# Patient Record
Sex: Female | Born: 1980 | Race: Black or African American | Hispanic: No | Marital: Married | Smoking: Never smoker
Health system: Southern US, Community
[De-identification: ages and names within clinical notes are randomized; demographics above are authoritative.]

## PROBLEM LIST (undated history)

## (undated) DIAGNOSIS — K219 Gastro-esophageal reflux disease without esophagitis: Secondary | ICD-10-CM

## (undated) DIAGNOSIS — T7840XA Allergy, unspecified, initial encounter: Secondary | ICD-10-CM

## (undated) HISTORY — DX: Allergy, unspecified, initial encounter: T78.40XA

---

## 2009-09-22 ENCOUNTER — Inpatient Hospital Stay (HOSPITAL_COMMUNITY): Admission: AD | Admit: 2009-09-22 | Discharge: 2009-09-22 | Payer: Self-pay | Admitting: Family Medicine

## 2009-09-22 ENCOUNTER — Ambulatory Visit: Payer: Self-pay | Admitting: Gynecology

## 2010-04-21 LAB — URINALYSIS, ROUTINE W REFLEX MICROSCOPIC
Bilirubin Urine: NEGATIVE
Glucose, UA: NEGATIVE mg/dL
Hgb urine dipstick: NEGATIVE
Ketones, ur: NEGATIVE mg/dL
Nitrite: NEGATIVE
Protein, ur: NEGATIVE mg/dL
Specific Gravity, Urine: 1.01 (ref 1.005–1.030)
Urobilinogen, UA: 0.2 mg/dL (ref 0.0–1.0)
pH: 6 (ref 5.0–8.0)

## 2010-04-21 LAB — POCT PREGNANCY, URINE: Preg Test, Ur: POSITIVE

## 2010-05-08 ENCOUNTER — Inpatient Hospital Stay (HOSPITAL_COMMUNITY)
Admission: AD | Admit: 2010-05-08 | Discharge: 2010-05-08 | Disposition: A | Discharge status: Home / Self Care | Payer: BC Managed Care – PPO | Source: Ambulatory Visit | Attending: Obstetrics and Gynecology | Admitting: Obstetrics and Gynecology

## 2010-05-08 ENCOUNTER — Inpatient Hospital Stay (HOSPITAL_COMMUNITY)
Admission: AD | Admit: 2010-05-08 | Discharge: 2010-05-11 | DRG: 373 | Disposition: A | Discharge status: Home / Self Care | Payer: BC Managed Care – PPO | Source: Ambulatory Visit | Attending: Obstetrics and Gynecology | Admitting: Obstetrics and Gynecology

## 2010-05-08 DIAGNOSIS — O99892 Other specified diseases and conditions complicating childbirth: Secondary | ICD-10-CM | POA: Diagnosis present

## 2010-05-08 DIAGNOSIS — O479 False labor, unspecified: Secondary | ICD-10-CM | POA: Insufficient documentation

## 2010-05-08 DIAGNOSIS — O411090 Infection of amniotic sac and membranes, unspecified, unspecified trimester, not applicable or unspecified: Secondary | ICD-10-CM | POA: Diagnosis present

## 2010-05-08 DIAGNOSIS — Z22330 Carrier of Group B streptococcus: Secondary | ICD-10-CM

## 2010-05-08 LAB — CBC
MCH: 30.1 pg (ref 26.0–34.0)
MCHC: 34.4 g/dL (ref 30.0–36.0)
Platelets: 249 10*3/uL (ref 150–400)

## 2010-05-08 LAB — RPR: RPR Ser Ql: NONREACTIVE

## 2010-05-09 ENCOUNTER — Other Ambulatory Visit: Payer: Self-pay | Admitting: Obstetrics and Gynecology

## 2010-05-12 NOTE — Discharge Summary (Signed)
  Alicia Boyer, Alicia Boyer NO.:  [id number]  MEDICAL RECORD NO.:  [id number]           PATIENT TYPE:  I  LOCATION:  9126                          FACILITY:  [facility]  PHYSICIAN:  Zenaida Niece, M.D.DATE OF BIRTH:  1980/04/29  DATE OF ADMISSION:  05/08/2010 DATE OF DISCHARGE:  05/11/2010                              DISCHARGE SUMMARY   ADMISSION DIAGNOSES: 1. Intrauterine pregnancy at 39 weeks, in labor. 2. Group B streptococcus carrier. 3. Meconium-stained amniotic fluid.  DISCHARGE DIAGNOSES: 1. Intrauterine pregnancy at 39 weeks, in labor. 2. Group B streptococcus carrier. 3. Meconium-stained amniotic fluid. 4. Probable chorioamnionitis.  PROCEDURE:  On May 09, 2010, she had a spontaneous vaginal delivery.  HISTORY AND PHYSICAL:  This is a 30 year old gravida 1, para 0 with an EGA of 39 plus weeks who presented to the office with complaint of regular contractions.  Cervix changed from 2 cm on the morning of May 08, 2010, to 5 cm in the office.  Prenatal care essentially uncomplicated.  PRENATAL LABS:  Blood type is O positive with negative antibody screen, rubella immune, 1-hour Glucola 92, group B strep is positive.  The remainder of her history is noncontributory.  PHYSICAL EXAMINATION:  VITAL SIGNS:  She is afebrile with stable vital signs.  Fetal heart tracing category 1 with contractions every 4-5 minutes. ABDOMEN:  Gravid, nontender with an estimated fetal weight of 7.5 pounds.  Cervix on my first exam, she is 6, 70, -1, vertex presentation, and membranes were ruptured, revealing thick meconium.  HOSPITAL COURSE:  The patient was admitted in labor and received an epidural.  She was put on penicillin for group B strep prophylaxis. Membranes were ruptured, revealing meconium.  She then had a protracted course and was started on Pitocin.  Just prior to delivery, she developed a temperature to 100.8 and fetal tachycardia, however, she progressed  to complete, pushed well, and early on the morning of May 09, 2010, had a vaginal delivery of a viable female infant with Apgars of 8 and 9, and weight of 6 pounds and 15 ounces.  The NICU team was at delivery for meconium.  Placenta delivered spontaneous was intact.  She had a second-degree laceration, repaired with 3-0 Vicryl.  She was not put on antibiotics after delivery and remained afebrile.  The baby ended up going to the NICU fairly early for hyperbilirubinemia and rule out sepsis.  The patient had no significant problems postpartum and again remained afebrile.  On postpartum day #2, she was felt to be stable for discharge home.  DISCHARGE INSTRUCTIONS:  Regular diet, pelvic rest, follow up in 6 weeks.  She was given our discharge pamphlet.  MEDICATIONS: 1. Percocet #20, 1-2 p.o. q.4-6 hours p.r.n. pain. 2. Over-the-counter ibuprofen as needed.     Zenaida Niece, M.D.     TDM/MEDQ  D:  05/11/2010  T:  05/11/2010  Job:  [id number]  Electronically Signed by Lavina Hamman M.D. on 05/12/2010 12:29:50 PM

## 2011-04-20 ENCOUNTER — Emergency Department (HOSPITAL_COMMUNITY): Payer: Worker's Compensation

## 2011-04-20 ENCOUNTER — Emergency Department (HOSPITAL_COMMUNITY)
Admission: EM | Admit: 2011-04-20 | Discharge: 2011-04-21 | Disposition: A | Discharge status: Home / Self Care | Payer: Worker's Compensation | Attending: Emergency Medicine | Admitting: Emergency Medicine

## 2011-04-20 ENCOUNTER — Encounter (HOSPITAL_COMMUNITY): Payer: Self-pay | Admitting: *Deleted

## 2011-04-20 DIAGNOSIS — S335XXA Sprain of ligaments of lumbar spine, initial encounter: Secondary | ICD-10-CM | POA: Insufficient documentation

## 2011-04-20 DIAGNOSIS — M545 Low back pain, unspecified: Secondary | ICD-10-CM | POA: Insufficient documentation

## 2011-04-20 DIAGNOSIS — X500XXA Overexertion from strenuous movement or load, initial encounter: Secondary | ICD-10-CM | POA: Insufficient documentation

## 2011-04-20 DIAGNOSIS — S39012A Strain of muscle, fascia and tendon of lower back, initial encounter: Secondary | ICD-10-CM

## 2011-04-20 DIAGNOSIS — Y93F2 Activity, caregiving, lifting: Secondary | ICD-10-CM | POA: Insufficient documentation

## 2011-04-20 MED ORDER — IBUPROFEN 800 MG PO TABS
800.0000 mg | ORAL_TABLET | Freq: Once | ORAL | Status: AC
  Administered 2011-04-21: 800 mg via ORAL
  Filled 2011-04-20: qty 1

## 2011-04-20 NOTE — ED Provider Notes (Addendum)
History     CSN: [id number]  Arrival date & time 04/20/11  2240   First MD Initiated Contact with Patient 04/20/11 2322      Chief Complaint  Patient presents with  . Back Pain    (Consider location/radiation/quality/duration/timing/severity/associated sxs/prior treatment) Patient is a 31 y.o. female presenting with back pain. The history is provided by the patient.  Back Pain  This is a new problem. The current episode started 3 to 5 hours ago. The problem occurs constantly. 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 The pain is present in the lumbar spine. The quality of the pain is described as stabbing. Radiates to: Radiates around to her bilateral sides. The pain is at a severity of 5/10. The pain is moderate. The symptoms are aggravated by bending, twisting and certain positions. The pain is the same all the time. Stiffness is present all day. Pertinent negatives include no chest pain, no abdominal pain, no bladder incontinence, no dysuria, no pelvic pain, no leg pain, no paresis, no tingling and no weakness. She has tried nothing for the symptoms. The treatment provided no relief.    History reviewed. No pertinent past medical history.  History reviewed. No pertinent past surgical history.  History reviewed. No pertinent family history.  History  Substance Use Topics  . Smoking status: Never Smoker   . Smokeless tobacco: Not on file  . Alcohol Use: No    OB History    Grav Para Term Preterm Abortions TAB SAB Ect Mult Living                  Review of Systems  Cardiovascular: Negative for chest pain.  Gastrointestinal: Negative for abdominal pain.  Genitourinary: Negative for bladder incontinence, dysuria and pelvic pain.  Musculoskeletal: Positive for back pain.  Neurological: Negative for tingling and  weakness.  All other systems reviewed and are negative.    Allergies  Review of patient's allergies indicates no known allergies.  Home Medications  No current outpatient prescriptions on file.  BP 144/75  Pulse 79  Temp 98.3 F (36.8 C)  Resp 15  SpO2 98%  Physical Exam  Nursing note and vitals reviewed. Constitutional: She is oriented to person, place, and time. She appears well-developed and well-nourished. No distress.  HENT:  Head: Normocephalic and atraumatic.  Eyes: EOM are normal. Pupils are equal, round, and reactive to light.  Neck: No spinous process tenderness and no muscular tenderness present.  Cardiovascular: Normal rate, regular rhythm, normal heart sounds and intact distal pulses.  Exam reveals no friction rub.   No murmur heard. Pulmonary/Chest: Effort normal and breath sounds normal. She has no wheezes. She has no rales.  Abdominal: Soft. Bowel sounds are normal. She exhibits no distension. There is no tenderness. There is no rebound and no guarding.  Musculoskeletal: Normal range of motion. She exhibits tenderness.       Lumbar back: She exhibits bony tenderness. She exhibits normal range of motion, no spasm and normal pulse.       No edema  Neurological: She is alert and oriented to person, place, and time. No cranial nerve deficit.  Skin: Skin is warm and dry. No rash noted.  Psychiatric: She has a normal mood and affect. Her behavior is normal.    ED Course  Procedures (  including critical care time)  Labs Reviewed - No data to display Dg Lumbar Spine Complete  04/21/2011  *RADIOLOGY REPORT*  Clinical Data: Injury to lower back; lower back pain.  LUMBAR SPINE - COMPLETE 4+ VIEW  Comparison: None.  Findings: There is no evidence of fracture or subluxation. Vertebral bodies demonstrate normal height and alignment. Intervertebral disc spaces are preserved.  The visualized neural foramina are grossly unremarkable in appearance.  The visualized bowel gas  pattern is unremarkable in appearance; air and stool are noted within the colon.  The sacroiliac joints are within normal limits.  IMPRESSION: No evidence of fracture or subluxation along the lumbar spine.  Original Report Authenticated By: Tonia Ghent, M.D.     1. Back strain       MDM   Patient with a back injury after trying to move a 200 kg patient. She was pulling on his bed sheet and felt acatch in her lower back. She has point tenderness around her L2-L3 radiation of the pain around to the sides. She has no lower motor deficits or pain.  Patient has no further complaints except for her lower back pain.  Normal vital signs the blood pressure here. Given mechanism of her injury will get plain films of her L-spine patient given ibuprofen.  1:40 AM Plain films neg and will give muscle relaxers and follow up.      Gwyneth Sprout, MD 04/21/11 0140  Gwyneth Sprout, MD 04/21/11 4098

## 2011-04-20 NOTE — ED Notes (Signed)
Went to check on pt.  Pt has been taken to radiology

## 2011-04-20 NOTE — ED Notes (Signed)
Pt c/o lower back pain tonight after she moved a pt up in the bed

## 2011-04-21 ENCOUNTER — Emergency Department (HOSPITAL_COMMUNITY): Payer: Worker's Compensation

## 2011-04-21 MED ORDER — IBUPROFEN 800 MG PO TABS: 800.0000 mg | ORAL_TABLET | Freq: Three times a day (TID) | ORAL | Status: AC

## 2011-04-21 NOTE — ED Notes (Signed)
Called radiology to inform them that pt upreg is negative.  Pt did not have x-ray on her first trip to radiology due to her concern that she may be pregnant

## 2011-04-21 NOTE — ED Notes (Signed)
Pt still awaiting phlebotomy to collect for her workers comp screening

## 2013-05-15 ENCOUNTER — Encounter (HOSPITAL_COMMUNITY): Payer: Self-pay | Admitting: Emergency Medicine

## 2013-05-15 ENCOUNTER — Emergency Department (HOSPITAL_COMMUNITY)
Admission: EM | Admit: 2013-05-15 | Discharge: 2013-05-15 | Disposition: A | Discharge status: Home / Self Care | Payer: BC Managed Care – PPO | Attending: Emergency Medicine | Admitting: Emergency Medicine

## 2013-05-15 DIAGNOSIS — L500 Allergic urticaria: Secondary | ICD-10-CM | POA: Insufficient documentation

## 2013-05-15 DIAGNOSIS — L509 Urticaria, unspecified: Secondary | ICD-10-CM

## 2013-05-15 MED ORDER — DIPHENHYDRAMINE HCL 25 MG PO CAPS: 25.0000 mg | ORAL_CAPSULE | Freq: Four times a day (QID) | ORAL | Status: DC | PRN

## 2013-05-15 MED ORDER — PREDNISONE 50 MG PO TABS: 50.0000 mg | ORAL_TABLET | Freq: Every day | ORAL | Status: DC

## 2013-05-15 NOTE — ED Notes (Signed)
Pt report some itchiness on her hands and back of left thigh.

## 2013-05-15 NOTE — Discharge Instructions (Signed)
Take the mediations prescribed. See your priamary care doctor, they might send you to an allergist. Return to the ER if there is any trouble breathing, swallowing.   Hives Hives are itchy, red, swollen areas of the skin. They can vary in size and location on your body. Hives can come and go for hours or several days (acute hives) or for several weeks (chronic hives). Hives do not spread from person to person (noncontagious). They may get worse with scratching, exercise, and emotional stress. CAUSES   Allergic reaction to food, additives, or drugs.  Infections, including the common cold.  Illness, such as vasculitis, lupus, or thyroid disease.  Exposure to sunlight, heat, or cold.  Exercise.  Stress.  Contact with chemicals. SYMPTOMS   Red or white swollen patches on the skin. The patches may change size, shape, and location quickly and repeatedly.  Itching.  Swelling of the hands, feet, and face. This may occur if hives develop deeper in the skin. DIAGNOSIS  Your caregiver can usually tell what is wrong by performing a physical exam. Skin or blood tests may also be done to determine the cause of your hives. In some cases, the cause cannot be determined. TREATMENT  Mild cases usually get better with medicines such as antihistamines. Severe cases may require an emergency epinephrine injection. If the cause of your hives is known, treatment includes avoiding that trigger.  HOME CARE INSTRUCTIONS   Avoid causes that trigger your hives.  Take antihistamines as directed by your caregiver to reduce the severity of your hives. Non-sedating or low-sedating antihistamines are usually recommended. Do not drive while taking an antihistamine.  Take any other medicines prescribed for itching as directed by your caregiver.  Wear loose-fitting clothing.  Keep all follow-up appointments as directed by your caregiver. SEEK MEDICAL CARE IF:   You have persistent or severe itching that is  not relieved with medicine.  You have painful or swollen joints. SEEK IMMEDIATE MEDICAL CARE IF:   You have a fever.  Your tongue or lips are swollen.  You have trouble breathing or swallowing.  You feel tightness in the throat or chest.  You have abdominal pain. These problems may be the first sign of a life-threatening allergic reaction. Call your local emergency services (911 in [place] MAKE SURE YOU:   Understand these instructions.  Will watch your condition.  Will get help right away if you are not doing well or get worse. Document Released: 01/22/2005 Document Revised: 07/24/2011 Document Reviewed: 04/17/2011 [facility] Norwegian StreetExitCare Patient Information 2014 [place], [place].

## 2013-05-15 NOTE — ED Notes (Signed)
Pt states she is having a reaction to something but does not know what  Pt states it started yesterday afternoon  Pt has welts noted on her hands  Pt states she has them on her thighs as well   Pt states it itches  Denies difficulty breathing or swallowing

## 2013-05-25 NOTE — ED Provider Notes (Signed)
CSN: [id number]     Arrival date & time 05/15/13  0442 History   First MD Initiated Contact with Patient 05/15/13 [phone]     Chief Complaint  Patient presents with  . Allergic Reaction     (Consider location/radiation/quality/duration/timing/severity/associated sxs/prior Treatment) HPI Comments: No hx of allergies in the past. No oral involvement. No dib, wheezing.  Patient is a 33 y.o. female presenting with allergic reaction. The history is provided by the patient.  Allergic Reaction Presenting symptoms: rash   Severity:  Mild Prior allergic episodes:  No prior episodes Context: no chemicals, no food allergies, no medications, no new detergents/soaps and no poison ivy   Ineffective treatments:  None tried   History reviewed. No pertinent past medical history. History reviewed. No pertinent past surgical history. History reviewed. No pertinent family history. History  Substance Use Topics  . Smoking status: Never Smoker   . Smokeless tobacco: Not on file  . Alcohol Use: No   OB History   Grav Para Term Preterm Abortions TAB SAB Ect Mult Living                 Review of Systems  Constitutional: Positive for activity change.  Respiratory: Negative for shortness of breath.   Cardiovascular: Negative for chest pain.  Gastrointestinal: Negative for nausea, vomiting and abdominal pain.  Genitourinary: Negative for dysuria.  Musculoskeletal: Negative for neck pain.  Skin: Positive for rash.  Neurological: Negative for headaches.      Allergies  Bee venom  Home Medications   Prior to Admission medications   Medication Sig Start Date End Date Taking? Authorizing Provider  ibuprofen (ADVIL,MOTRIN) 200 MG tablet Take 400 mg by mouth every 6 (six) hours as needed for moderate pain.   Yes Historical Provider, MD  pseudoephedrine-acetaminophen (TYLENOL SINUS) 30-500 MG TABS Take 1 tablet by mouth every 4 (four) hours as needed (cold symptoms).   Yes Historical Provider, MD   sodium-potassium bicarbonate (ALKA-SELTZER GOLD) TBEF dissolvable tablet Take 1 tablet by mouth 2 (two) times daily as needed (cold symptoms).   Yes Historical Provider, MD  diphenhydrAMINE (BENADRYL) 25 mg capsule Take 1 capsule (25 mg total) by mouth every 6 (six) hours as needed for itching. 05/15/13   Derwood KaplanAnkit Jahred Tatar, MD  predniSONE (DELTASONE) 50 MG tablet Take 1 tablet (50 mg total) by mouth daily. 05/15/13   Liridona Mashaw Rhunette CroftNanavati, MD   BP 119/76  Pulse 58  Temp(Src) 98.3 F (36.8 C) (Oral)  Resp 18  SpO2 100%  LMP 04/27/2013 Physical Exam  Nursing note and vitals reviewed. Constitutional: She is oriented to person, place, and time. She appears well-developed and well-nourished.  HENT:  Head: Normocephalic and atraumatic.  Eyes: EOM are normal. Pupils are equal, round, and reactive to light.  Neck: Neck supple.  Cardiovascular: Normal rate, regular rhythm and normal heart sounds.   No murmur heard. Pulmonary/Chest: Effort normal. No respiratory distress.  Abdominal: Soft. She exhibits no distension. There is no tenderness. There is no rebound and no guarding.  Neurological: She is alert and oriented to person, place, and time.  Skin: Skin is warm and dry. Rash noted.    ED Course  Procedures (including critical care time) Labs Review Labs Reviewed - No data to display  Imaging Review No results found.   EKG Interpretation None      MDM   Final diagnoses:  Urticaria of unknown origin    Pt with cc of rash, with itching. Wheals like lesion appreciated. Allergy meds given.  Derwood KaplanAnkit Idelia Caudell, MD 05/25/13 [phone]

## 2014-09-24 ENCOUNTER — Ambulatory Visit (INDEPENDENT_AMBULATORY_CARE_PROVIDER_SITE_OTHER): Payer: BLUE CROSS/BLUE SHIELD | Admitting: Urgent Care

## 2014-09-24 VITALS — BP 124/82 | HR 71 | Temp 98.8°F | Resp 16 | Ht 63.0 in | Wt 169.0 lb

## 2014-09-24 DIAGNOSIS — Z0000 Encounter for general adult medical examination without abnormal findings: Secondary | ICD-10-CM | POA: Diagnosis not present

## 2014-09-24 DIAGNOSIS — Z7185 Encounter for immunization safety counseling: Secondary | ICD-10-CM

## 2014-09-24 DIAGNOSIS — Z7189 Other specified counseling: Secondary | ICD-10-CM | POA: Diagnosis not present

## 2014-09-24 LAB — COMPREHENSIVE METABOLIC PANEL
ALK PHOS: 87 U/L (ref 33–115)
ALT: 19 U/L (ref 6–29)
AST: 17 U/L (ref 10–30)
Albumin: 4.1 g/dL (ref 3.6–5.1)
BUN: 8 mg/dL (ref 7–25)
CO2: 27 mmol/L (ref 20–31)
CREATININE: 0.73 mg/dL (ref 0.50–1.10)
Calcium: 9.2 mg/dL (ref 8.6–10.2)
Chloride: 100 mmol/L (ref 98–110)
Glucose, Bld: 88 mg/dL (ref 65–99)
POTASSIUM: 4.2 mmol/L (ref 3.5–5.3)
SODIUM: 138 mmol/L (ref 135–146)
TOTAL PROTEIN: 7.7 g/dL (ref 6.1–8.1)
Total Bilirubin: 0.5 mg/dL (ref 0.2–1.2)

## 2014-09-24 LAB — CBC
HCT: 41.2 % (ref 36.0–46.0)
Hemoglobin: 13.7 g/dL (ref 12.0–15.0)
MCH: 29.3 pg (ref 26.0–34.0)
MCHC: 33.3 g/dL (ref 30.0–36.0)
MCV: 88 fL (ref 78.0–100.0)
MPV: 10.4 fL (ref 8.6–12.4)
PLATELETS: 371 10*3/uL (ref 150–400)
RBC: 4.68 MIL/uL (ref 3.87–5.11)
RDW: 13.3 % (ref 11.5–15.5)
WBC: 5.5 10*3/uL (ref 4.0–10.5)

## 2014-09-24 LAB — HEMOGLOBIN A1C
HEMOGLOBIN A1C: 5.6 % (ref <5.7)
MEAN PLASMA GLUCOSE: 114 mg/dL (ref <117)

## 2014-09-24 LAB — LIPID PANEL
Cholesterol: 165 mg/dL (ref 125–200)
HDL: 58 mg/dL (ref >=46)
LDL Cholesterol: 92 mg/dL (ref <130)
Total CHOL/HDL Ratio: 2.8 Ratio (ref <=5.0)
Triglycerides: 73 mg/dL (ref <150)
VLDL: 15 mg/dL (ref <30)

## 2014-09-24 LAB — TSH: TSH: 1.037 u[IU]/mL (ref 0.350–4.500)

## 2014-09-24 NOTE — Progress Notes (Signed)
    MRN: [id number] DOB: 1980-06-02  Subjective:   Alicia Boyer is a 34 y.o. 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 Per the [place] database her immunizations are up to date. Denies smoking cigarettes or drinking alcohol. Denies any other aggravating or relieving factors, no other questions or concerns.  Alicia Boyer has a current medication list which includes the following prescription(s): vitamin c and ginkgo. 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  Alicia Boyer  has a past medical history of Allergy. Also  has no past surgical history on file.  Objective:   Vitals: BP 124/82 mmHg  Pulse 71  Temp(Src) 98.8 F (37.1 C) (Oral)  Resp 16  Ht  (1.6 m)  Wt 169 lb (76.658 kg)  BMI 29.94 kg/m2  SpO2 97%  LMP 09/14/2014  Physical Exam  Constitutional: She is oriented to person, place, and time. She appears well-developed and well-nourished.  HENT:  TM's intact bilaterally, no effusions or erythema. Nares patent, nasal turbinates pink and moist, nasal passages patent. No sinus tenderness. Oropharynx clear, mucous membranes moist, dentition in good repair.  Eyes: Conjunctivae and EOM are normal. Pupils are equal, round, and reactive to light. Right eye exhibits no discharge. Left eye exhibits no discharge. No scleral icterus.  Neck: Normal range of motion. Neck supple. No thyromegaly present.  Cardiovascular: Normal rate, regular rhythm and intact distal pulses.  Exam reveals no gallop and no friction rub.   No murmur heard. Pulmonary/Chest: No respiratory distress. She has no wheezes. She has no rales.  Abdominal: Soft. Bowel sounds are normal. She exhibits no distension and no mass. There is no tenderness.  Genitourinary:  Deferred to her gynecologist.  Musculoskeletal: Normal range of motion. She  exhibits no edema or tenderness.  Lymphadenopathy:    She has no cervical adenopathy.  Neurological: She is alert and oriented to person, place, and time. She has normal reflexes.  Skin: Skin is warm and dry. No rash noted. No erythema. No pallor.  Psychiatric: She has a normal mood and affect.   Assessment and Plan :   1. Annual physical exam - Medically healthy - Discussed healthy lifestyle, diet, exercise, preventative care, vaccinations, and addressed patient's concerns.  - CBC - Comprehensive metabolic panel - TSH - Lipid panel - Hemoglobin A1c  2. Encounter for counseling regarding immunization - Labs pending, requested she have her results mailed to her. - Measles/Mumps/Rubella Immunity - Hepatitis B surface antibody - Varicella zoster antibody, IgG   Wallis Bamberg, PA-C [facility] [phone] 09/24/2014 4:11 PM

## 2014-09-24 NOTE — Patient Instructions (Signed)

## 2014-09-25 LAB — HEPATITIS B SURFACE ANTIBODY, QUANTITATIVE: HEPATITIS B-POST: 12.4 m[IU]/mL

## 2014-09-27 ENCOUNTER — Ambulatory Visit (INDEPENDENT_AMBULATORY_CARE_PROVIDER_SITE_OTHER): Payer: BLUE CROSS/BLUE SHIELD | Admitting: Physician Assistant

## 2014-09-27 ENCOUNTER — Telehealth: Payer: Self-pay

## 2014-09-27 DIAGNOSIS — Z23 Encounter for immunization: Secondary | ICD-10-CM | POA: Diagnosis not present

## 2014-09-27 DIAGNOSIS — Z131 Encounter for screening for diabetes mellitus: Secondary | ICD-10-CM | POA: Diagnosis not present

## 2014-09-27 DIAGNOSIS — R300 Dysuria: Secondary | ICD-10-CM | POA: Diagnosis not present

## 2014-09-27 LAB — POCT URINE PREGNANCY: PREG TEST UR: NEGATIVE

## 2014-09-27 LAB — POCT URINALYSIS DIPSTICK
Bilirubin, UA: NEGATIVE
Glucose, UA: NEGATIVE
Ketones, UA: NEGATIVE
NITRITE UA: NEGATIVE
PH UA: 7
Protein, UA: NEGATIVE
RBC UA: NEGATIVE
Spec Grav, UA: 1.015
UROBILINOGEN UA: 0.2

## 2014-09-27 LAB — POCT UA - MICROSCOPIC ONLY
Casts, Ur, LPF, POC: NEGATIVE
Crystals, Ur, HPF, POC: NEGATIVE
Mucus, UA: NEGATIVE
Yeast, UA: NEGATIVE

## 2014-09-27 LAB — MEASLES/MUMPS/RUBELLA IMMUNITY
Mumps IgG: 66.6 AU/mL — ABNORMAL HIGH (ref <9.00)
Rubella: 4.2 Index — ABNORMAL HIGH (ref <0.90)
Rubeola IgG: 275 AU/mL — ABNORMAL HIGH (ref <25.00)

## 2014-09-27 LAB — VARICELLA ZOSTER ANTIBODY, IGG: Varicella IgG: 41.36 Index (ref <135.00)

## 2014-09-27 MED ORDER — VARICELLA VIRUS VACCINE LIVE 1350 PFU/0.5ML IJ SUSR: 0.5000 mL | Freq: Once | INTRAMUSCULAR | Status: AC

## 2014-09-27 NOTE — Telephone Encounter (Signed)
Alicia Boyer - Pt saw you for CPE on 09-24-14 & forgot to bring in a form.  I have left this in the nurses box.  [phone]

## 2014-09-29 ENCOUNTER — Encounter: Payer: Self-pay | Admitting: Urgent Care

## 2014-09-29 NOTE — Telephone Encounter (Signed)
Patient called, message left to report labs were unremarkable including normal electrolytes, kidney function, liver enzymes, cholesterol, thyroid function, blood cell counts. I will complete form tomorrow. This call was actually not routed to my inbox. I happened upon it because I was completing a call to patient.

## 2015-12-23 ENCOUNTER — Emergency Department (HOSPITAL_COMMUNITY)
Admission: EM | Admit: 2015-12-23 | Discharge: 2015-12-23 | Disposition: A | Discharge status: Home / Self Care | Payer: BLUE CROSS/BLUE SHIELD | Attending: Emergency Medicine | Admitting: Emergency Medicine

## 2015-12-23 ENCOUNTER — Encounter (HOSPITAL_COMMUNITY): Payer: Self-pay

## 2015-12-23 ENCOUNTER — Emergency Department (HOSPITAL_COMMUNITY): Payer: BLUE CROSS/BLUE SHIELD

## 2015-12-23 DIAGNOSIS — R0789 Other chest pain: Secondary | ICD-10-CM

## 2015-12-23 DIAGNOSIS — R079 Chest pain, unspecified: Secondary | ICD-10-CM | POA: Diagnosis present

## 2015-12-23 HISTORY — DX: Gastro-esophageal reflux disease without esophagitis: K21.9

## 2015-12-23 LAB — I-STAT TROPONIN, ED: Troponin i, poc: 0 ng/mL (ref 0.00–0.08)

## 2015-12-23 LAB — BASIC METABOLIC PANEL
ANION GAP: 8 (ref 5–15)
BUN: 12 mg/dL (ref 6–20)
CHLORIDE: 103 mmol/L (ref 101–111)
CO2: 26 mmol/L (ref 22–32)
Calcium: 9 mg/dL (ref 8.9–10.3)
Creatinine, Ser: 0.79 mg/dL (ref 0.44–1.00)
GFR calc non Af Amer: >60 mL/min (ref >60)
Glucose, Bld: 110 mg/dL — ABNORMAL HIGH (ref 65–99)
POTASSIUM: 3.3 mmol/L — AB (ref 3.5–5.1)
SODIUM: 137 mmol/L (ref 135–145)

## 2015-12-23 LAB — CBC
HEMATOCRIT: 37.6 % (ref 36.0–46.0)
HEMOGLOBIN: 12.5 g/dL (ref 12.0–15.0)
MCH: 28.7 pg (ref 26.0–34.0)
MCHC: 33.2 g/dL (ref 30.0–36.0)
MCV: 86.4 fL (ref 78.0–100.0)
PLATELETS: 328 10*3/uL (ref 150–400)
RBC: 4.35 MIL/uL (ref 3.87–5.11)
RDW: 13.5 % (ref 11.5–15.5)
WBC: 7.7 10*3/uL (ref 4.0–10.5)

## 2015-12-23 NOTE — ED Triage Notes (Signed)
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 She developed chest pressure , non-radiating under her lt. Lateral breast area.  She denies any n/v/d or sob.  ECG completed in triage. Skin is warm and dry.

## 2015-12-23 NOTE — ED Provider Notes (Signed)
[facility]-EMERGENCY DEPT Provider Note   CSN: [id number] Arrival date & time: 12/23/15  1750     History   Chief Complaint Chief Complaint  Patient presents with  . Chest Pain    HPI Sherron Flemingsrene Lemon is a 35 y.o. female with a past medical history of GERD who presents to the emergency department with 6 hours of constant, dull, 6/10 chest pain, radiating to the left axilla. She states that the pain got progressively worse over time and does not resemble any GERD or reflux pain she has had in the past. Pain is located on the left mid-clavicular chest under her breast. She has not tried anything for the pain and nothing makes it better or worse.  She denies prolong immobilization, recent surgery, use of oral contraceptives or any history of drug use including cocaine. Patient endorses a stressful environment at work.She denies any other symptoms including nausea, vomiting, abdominal pain, shortness of breath, dizziness, syncope or rash.  HPI  Past Medical History:  Diagnosis Date  . Allergy   . GERD (gastroesophageal reflux disease)     There are no active problems to display for this patient.   History reviewed. No pertinent surgical history.  OB History    No data available       Home Medications    Prior to Admission medications   Medication Sig Start Date End Date Taking? Authorizing Provider  pantoprazole (PROTONIX) 40 MG tablet Take 40 mg by mouth daily.   Yes Historical Provider, MD  varicella virus vaccine live (VARIVAX) 1350 PFU/0.5ML INJ injection Inject 0.5 mLs into the skin once. Patient not taking: Reported on 12/23/2015 09/27/14   Dorna LeitzNicole V Bush, PA-C    Family History Family History  Problem Relation Age of Onset  . Diabetes Maternal Grandmother   . Hypertension Maternal Grandmother   . Stroke Paternal Grandfather     Social History Social History  Substance Use Topics  . Smoking status: Never Smoker  . Smokeless tobacco: Never Used  . Alcohol use No      Allergies   Bee venom   Review of Systems Review of Systems  Constitutional: Negative for appetite change, chills, diaphoresis, fatigue, fever and unexpected weight change.  HENT: Negative for congestion and sore throat.   Respiratory: Negative for apnea, cough, choking, chest tightness, shortness of breath, wheezing and stridor.   Cardiovascular: Positive for chest pain. Negative for palpitations and leg swelling.       Patient describes a dull 6 out of 10 chest pain radiating to the axilla  Gastrointestinal: Negative for abdominal distention, abdominal pain, nausea and vomiting.  Genitourinary: Negative for difficulty urinating and dysuria.  Musculoskeletal: Negative for back pain, gait problem, myalgias, neck pain and neck stiffness.  Skin: Negative for color change, pallor, rash and wound.  Neurological: Negative for dizziness, tremors, syncope, weakness, light-headedness, numbness and headaches.     Physical Exam Updated Vital Signs BP 128/79   Pulse 69   Temp 98.4 F (36.9 C) (Oral)   Resp 20   Ht 5\' 3"  (1.6 m)   LMP 12/07/2015 (Approximate)   SpO2 100%   Physical Exam  Constitutional: She appears well-developed and well-nourished. No distress.  She is a well-appearing, cooperative, nontoxic appearing female comfortably seated in bed  HENT:  Head: Normocephalic and atraumatic.  Eyes: Right eye exhibits no discharge. Left eye exhibits no discharge.  Neck: Normal range of motion. Neck supple. No JVD present. No tracheal deviation present.  Cardiovascular: Normal rate and  regular rhythm.  Exam reveals no gallop.   No murmur heard. Pulmonary/Chest: No stridor. No respiratory distress. She has wheezes. She has no rales. She exhibits no tenderness.  Very faint expiratory wheezing appreciated in the right middle lobe  Abdominal: Soft. Bowel sounds are normal. She exhibits no distension and no mass. There is no tenderness. There is no rebound and no guarding.   Musculoskeletal: Normal range of motion. She exhibits no tenderness.  She is nontender to palpation of chest wall and axilla  Skin: Skin is warm and dry. No rash noted. She is not diaphoretic. No erythema. No pallor.  Psychiatric: She has a normal mood and affect. Her behavior is normal.  Nursing note and vitals reviewed.    ED Treatments / Results  Labs (all labs ordered are listed, but only abnormal results are displayed) Labs Reviewed  BASIC METABOLIC PANEL - Abnormal; Notable for the following:       Result Value   Potassium 3.3 (*)    Glucose, Bld 110 (*)    All other components within normal limits  CBC  I-STAT TROPOININ, ED    EKG  EKG Interpretation  Date/Time:  Friday December 23 2015 17:58:30 EST Ventricular Rate:  71 PR Interval:  132 QRS Duration: 74 QT Interval:  382 QTC Calculation: 415 R Axis:   55 Text Interpretation:  Normal sinus rhythm Nonspecific T wave abnormality Abnormal ECG No old tracing to compare Confirmed by CAMPOS  MD, Caryn BeeKEVIN ([id number]) on 12/23/2015 9:46:39 PM       Radiology Dg Chest 2 View  Result Date: 12/23/2015 CLINICAL DATA:  Chest pain under LEFT breast and LEFT axilla beginning today. EXAM: CHEST  2 VIEW COMPARISON:  Chest radiograph February 22, 2015 FINDINGS: Cardiomediastinal silhouette is normal. No pleural effusions or focal consolidations. Trachea projects midline and there is no pneumothorax. Soft tissue planes and included osseous structures are non-suspicious. IMPRESSION: Normal chest. Electronically Signed   By: Awilda Metroourtnay  Bloomer M.D.   On: 12/23/2015 19:11    Procedures Procedures (including critical care time)  Medications Ordered in ED Medications - No data to display   Initial Impression / Assessment and Plan / ED Course  I have reviewed the triage vital signs and the nursing notes.  Pertinent labs & imaging results that were available during my care of the patient were reviewed by me and considered in my medical  decision making (see chart for details).  Clinical Course    Sherron Flemingsrene Mowrey is a 35 year old female with no significant past medical medical history with the exception of GERD, who presents with 6 hours of dull 6/10 midclavicular left sided lower chest pain radiating to the axilla.  She is otherwise healthy and reports no associated symptoms, such as, shortness of breath, nausea, vomiting. EKG normal sinus rhythm. First set of cardiac enzymes negative at 1808. She is young and otherwise healthy without any comorbidities and my suspicion for ACS in this patient is low.   Negative Chest x-ray and lack of upper respiratory symptoms, cough, or fever in addition to a reassuring exam, gives me low suspicion for pneumonia.  Lack of hypoxia, shortness of breath, tachycardia, smoking, use of oral contraceptives, prolonged immobilization, recent trauma or surgery, malignancy, in combination with young age, and negative prior history of DVT or PE lowers my suspicion for PE in this patient.  Discussed with patient the possibility of an inflammatory process or chest wall muscle strain as the underlying etiology. She is comfortable with going home now  that she knows her EKG and chest x-ray was negative. Suggested naproxen and patient stated that she would take ibuprofen over-the-counter. Patient agrees with treatment plan.  Discussed with patient strict return precautions. Patient was advised to return to the emergency department if experiencing any worsening of symptoms including but not limited to, worsening chest pain, shortness of breath, nausea/vomiting, dizziness, syncope, or fever and chills.  Patient was also seen by Dr. Patria Mane who agrees with assessment and plan   Final Clinical Impressions(s) / ED Diagnoses   Final diagnoses:  Chest wall pain    New Prescriptions New Prescriptions   No medications on file     [place] Shore, [place] 12/23/15 2258    Azalia Bilis, MD 12/23/15 [phone]

## 2015-12-23 NOTE — ED Notes (Signed)
Pt departed in NAD, refused use of wheelchair.  

## 2016-07-10 ENCOUNTER — Other Ambulatory Visit: Payer: Self-pay | Admitting: Internal Medicine

## 2016-07-10 ENCOUNTER — Ambulatory Visit
Admission: RE | Admit: 2016-07-10 | Discharge: 2016-07-10 | Disposition: A | Discharge status: Home / Self Care | Payer: No Typology Code available for payment source | Source: Ambulatory Visit | Attending: Internal Medicine | Admitting: Internal Medicine

## 2016-07-10 DIAGNOSIS — Z111 Encounter for screening for respiratory tuberculosis: Secondary | ICD-10-CM

## 2019-01-15 ENCOUNTER — Other Ambulatory Visit: Payer: Self-pay

## 2019-01-15 DIAGNOSIS — Z20822 Contact with and (suspected) exposure to covid-19: Secondary | ICD-10-CM

## 2019-01-17 LAB — NOVEL CORONAVIRUS, NAA: SARS-CoV-2, NAA: NOT DETECTED

## 2019-03-17 ENCOUNTER — Telehealth: Payer: BLUE CROSS/BLUE SHIELD | Admitting: Physician Assistant

## 2019-03-17 DIAGNOSIS — Z20822 Contact with and (suspected) exposure to covid-19: Secondary | ICD-10-CM

## 2019-03-17 MED ORDER — BENZONATATE 100 MG PO CAPS: 100.0000 mg | ORAL_CAPSULE | Freq: Three times a day (TID) | ORAL | 0 refills | Status: AC | PRN

## 2019-03-17 NOTE — Progress Notes (Signed)
E-Visit for Corona Virus Screening  Your current symptoms could be consistent with the coronavirus.  I understand that your job is requiring 2 - tests before you return however we need to wait for the result of the first test.  Even though you are vaccinated it is still possible for you to become infected with COVID-19 and if your first test is positive then we would not recommend retesting for 90 days.  If your first test is negative a repeat test would be reasonable at that time.  I will send some medications to help with the symptoms to your pharmacy of choice.  Please let us know when the result of your first test is in.  If it is negative, we can use the screening here for second test given your symptoms.   Please quarantine yourself while awaiting your test results. Please stay home for a minimum of 10 days from the first day of illness with improving symptoms and you have had 24 hours of no fever (without the use of Tylenol (Acetaminophen) Motrin (Ibuprofen) or any fever reducing medication).  Also - Do not get tested prior to returning to work because once you have had a positive test the test can stay positive for more then a month in some cases.   You should wear a mask or cloth face covering over your nose and mouth if you must be around other people or animals, including pets (even at home). Try to stay at least 6 feet away from other people. This will protect the people around you.  Please continue good preventive care measures, including:  frequent hand-washing, avoid touching your face, cover coughs/sneezes, stay out of crowds and keep a 6 foot distance from others.  COVID-19 is a respiratory illness with symptoms that are similar to the flu. Symptoms are typically mild to moderate, but there have been cases of severe illness and death due to the virus.   The following symptoms may appear 2-14 days after exposure: . Fever . Cough . Shortness of breath or difficulty  breathing . Chills . Repeated shaking with chills . Muscle pain . Headache . Sore throat . New loss of taste or smell . Fatigue . Congestion or runny nose . Nausea or vomiting . Diarrhea  Go to the nearest hospital ED for assessment if fever/cough/breathlessness are severe or illness seems like a threat to life.  It is vitally important that if you feel that you have an infection such as this virus or any other virus that you stay home and away from places where you may spread it to others.  You should avoid contact with people age 45 and older.   You can use medication such as A prescription cough medication called Tessalon Perles 100 mg. You may take 1-2 capsules every 8 hours as needed for cough  You may also take acetaminophen (Tylenol) as needed for fever.  Reduce your risk of any infection by using the same precautions used for avoiding the common cold or flu:  Marland Kitchen Wash your hands often with soap and warm water for at least 20 seconds.  If soap and water are not readily available, use an alcohol-based hand sanitizer with at least 60% alcohol.  . If coughing or sneezing, cover your mouth and nose by coughing or sneezing into the elbow areas of your shirt or coat, into a tissue or into your sleeve (not your hands). . Avoid shaking hands with others and consider head nods or verbal greetings  only. . Avoid touching your eyes, nose, or mouth with unwashed hands.  . Avoid close contact with people who are sick. . Avoid places or events with large numbers of people in one location, like concerts or sporting events. . Carefully consider travel plans you have or are making. . If you are planning any travel outside or inside the [place], visit the CDC's Travelers' Health webpage for the latest health notices. . If you have some symptoms but not all symptoms, continue to monitor at home and seek medical attention if your symptoms worsen. . If you are having a medical emergency, call 911.  HOME  CARE . Only take medications as instructed by your medical team. . Drink plenty of fluids and get plenty of rest. . A steam or ultrasonic humidifier can help if you have congestion.   GET HELP RIGHT AWAY IF YOU HAVE EMERGENCY WARNING SIGNS** FOR COVID-19. If you or someone is showing any of these signs seek emergency medical care immediately. Call 911 or proceed to your closest emergency facility if: . You develop worsening high fever. . Trouble breathing . Bluish lips or face . Persistent pain or pressure in the chest . New confusion . Inability to wake or stay awake . You cough up blood. . Your symptoms become more severe  **This list is not all possible symptoms. Contact your medical provider for any symptoms that are sever or concerning to you.  MAKE SURE YOU   Understand these instructions.  Will watch your condition.  Will get help right away if you are not doing well or get worse.  Your e-visit answers were reviewed by a board certified advanced clinical practitioner to complete your personal care plan.  Depending on the condition, your plan could have included both over the counter or prescription medications.  If there is a problem please reply once you have received a response from your provider.  Your safety is important to [place].  If you have drug allergies check your prescription carefully.    You can use MyChart to ask questions about today's visit, request a non-urgent call back, or ask for a work or school excuse for 24 hours related to this e-Visit. If it has been greater than 24 hours you will need to follow up with your provider, or enter a new e-Visit to address those concerns. You will get an e-mail in the next two days asking about your experience.  I hope that your e-visit has been valuable and will speed your recovery. Thank you for using e-visits.   Greater than 5 minutes, yet less than 10 minutes of time have been spent researching, coordinating, and  implementing care for this patient today

## 2020-06-23 ENCOUNTER — Other Ambulatory Visit: Payer: Self-pay | Admitting: Obstetrics and Gynecology

## 2020-06-23 DIAGNOSIS — N6310 Unspecified lump in the right breast, unspecified quadrant: Secondary | ICD-10-CM

## 2020-07-29 ENCOUNTER — Other Ambulatory Visit: Payer: Self-pay

## 2020-07-29 ENCOUNTER — Ambulatory Visit
Admission: RE | Admit: 2020-07-29 | Discharge: 2020-07-29 | Disposition: A | Discharge status: Home / Self Care | Payer: BLUE CROSS/BLUE SHIELD | Source: Ambulatory Visit | Attending: Obstetrics and Gynecology | Admitting: Obstetrics and Gynecology

## 2020-07-29 ENCOUNTER — Ambulatory Visit
Admission: RE | Admit: 2020-07-29 | Discharge: 2020-07-29 | Disposition: A | Discharge status: Home / Self Care | Payer: BC Managed Care – PPO | Source: Ambulatory Visit | Attending: Obstetrics and Gynecology | Admitting: Obstetrics and Gynecology

## 2020-07-29 DIAGNOSIS — N6310 Unspecified lump in the right breast, unspecified quadrant: Secondary | ICD-10-CM

## 2022-01-24 IMAGING — MG DIGITAL DIAGNOSTIC BILAT W/ TOMO W/ CAD
6 of 12 series · 6 of 36 positions shown · non-contrast
Comparison: None.

CLINICAL DATA: 39-year-old female complaining of 3 palpable
abnormalities in the right breast.

EXAM:
DIGITAL DIAGNOSTIC BILATERAL MAMMOGRAM WITH TOMOSYNTHESIS AND CAD;
ULTRASOUND RIGHT BREAST LIMITED
TECHNIQUE: Bilateral digital diagnostic mammography and breast tomosynthesis
was performed. The images were evaluated with computer-aided
detection.; Targeted ultrasound examination of the right breast was
performed

[R TAN synth-2D (1 of 2)]
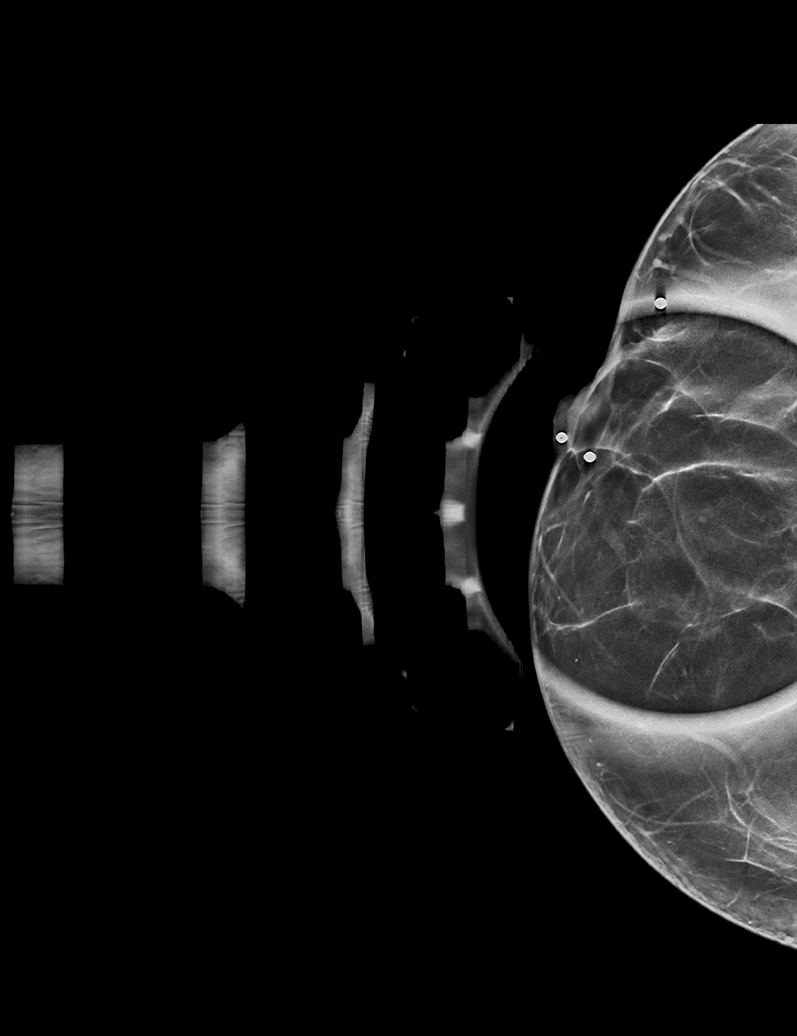

[R TAN synth-2D (2 of 2)]
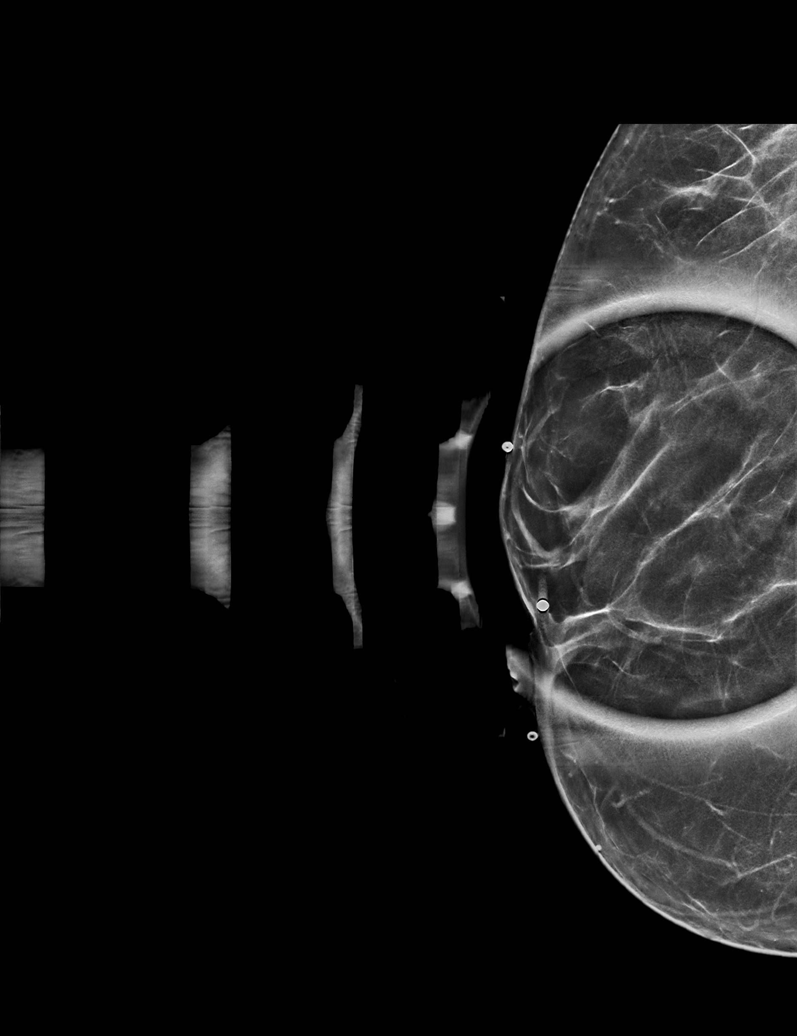

[L CC synth-2D]
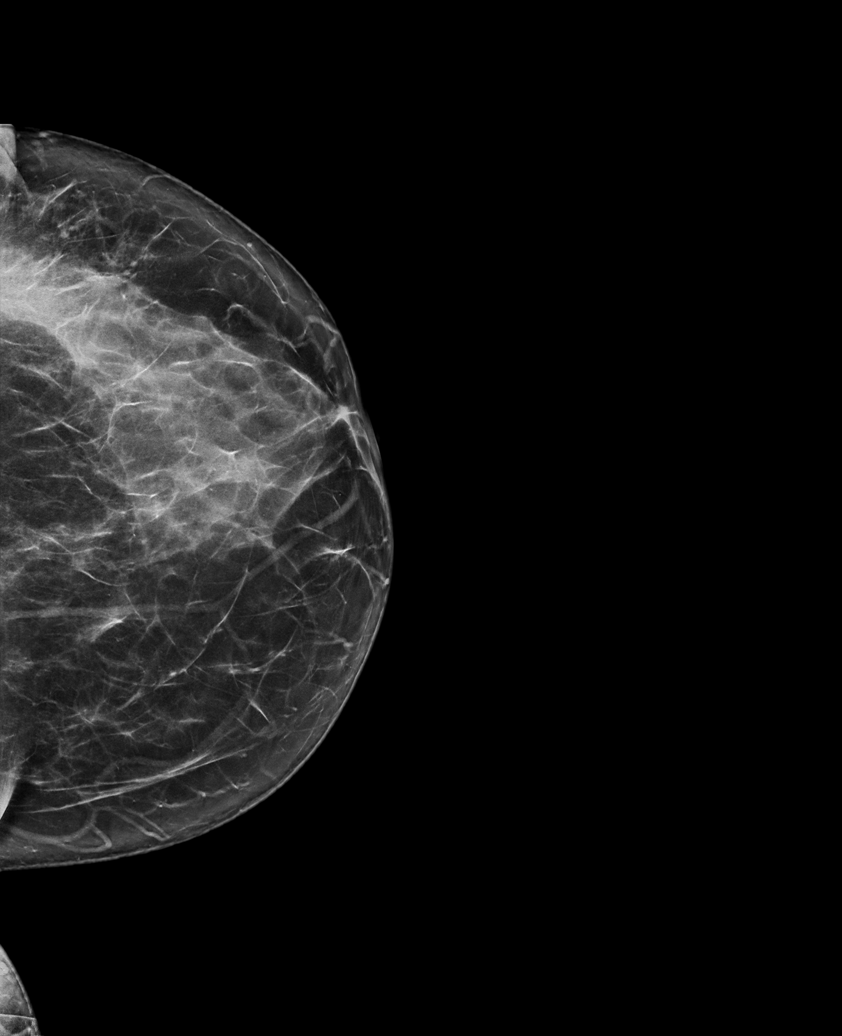

[R MLO synth-2D]
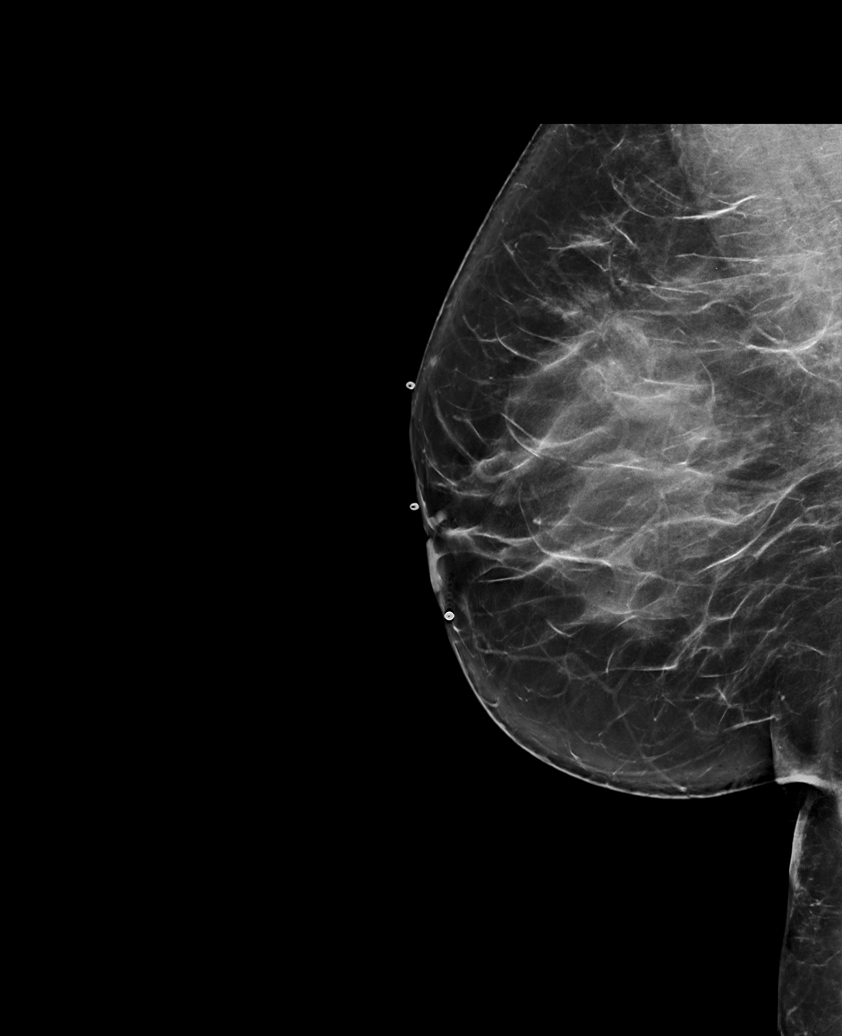

[L MLO synth-2D]
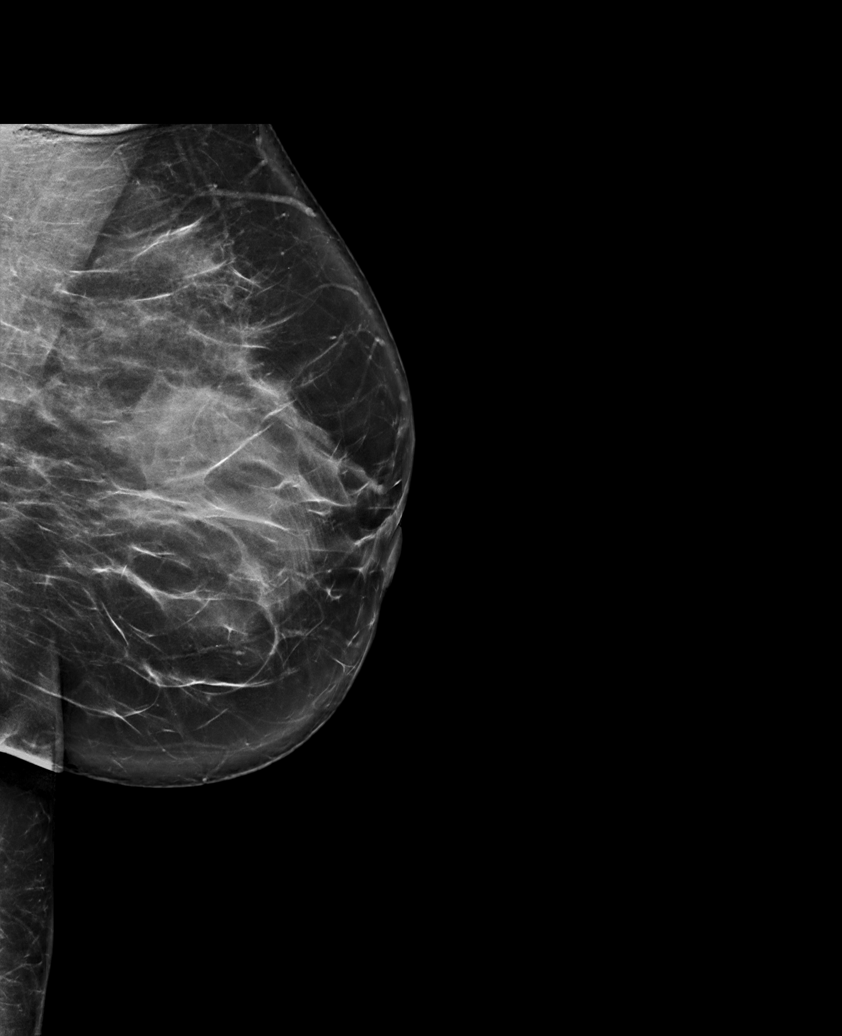

[R CC synth-2D]
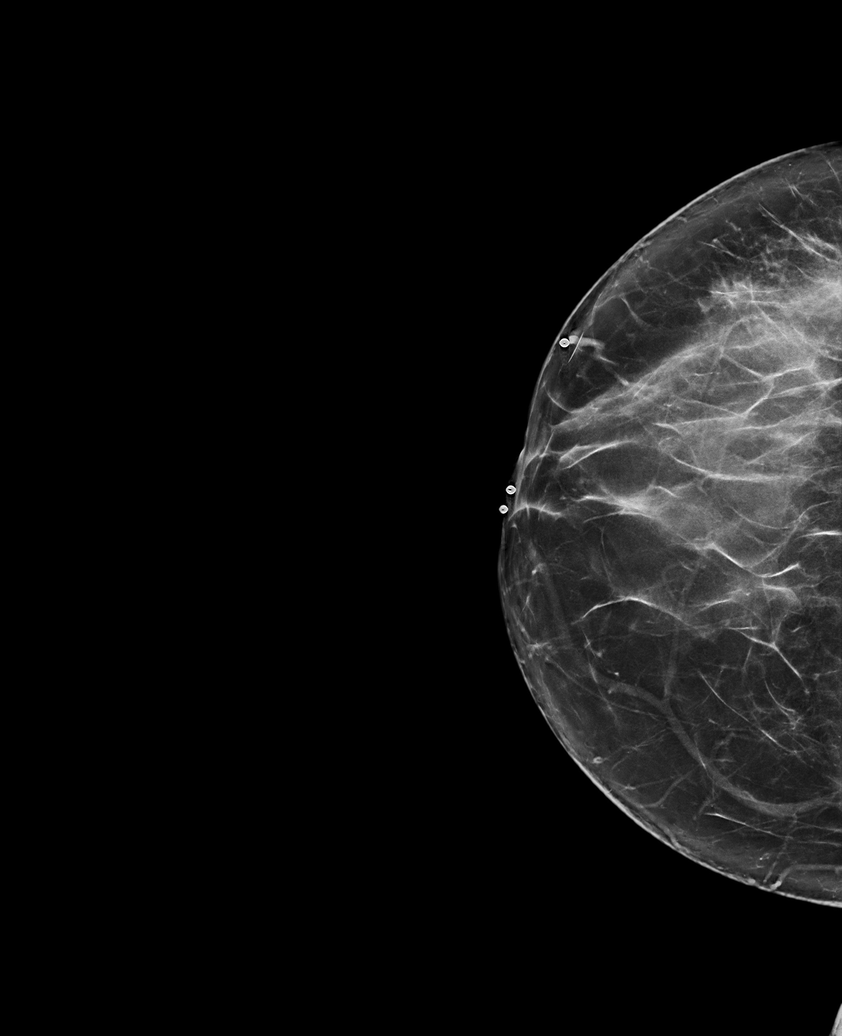

[6 of 36 positions shown; findings below may reference images not displayed]

ACR Breast Density Category c: The breast tissue is heterogeneously
dense, which may obscure small masses.
FINDINGS: No suspicious mass, malignant type microcalcifications or distortion
detected in either breast.

On physical exam, I do not palpate a mass in the areas of clinical
concern in the right breast at [DATE] 1 cm from the nipple, [DATE] in
the retroareolar region or 6 o'clock in the retroareolar region.

Targeted ultrasound is performed, showing normal tissue in the areas
of clinical concern in the right breast at [DATE] 1 cm from the
nipple, [DATE] in the retroareolar region and 6 o'clock in the
retroareolar region. No solid or cystic mass, abnormal shadowing or
distortion visualized.
IMPRESSION: No evidence of malignancy in either breast.

RECOMMENDATION:
If the clinical exam remains benign/stable screening mammography can
be deferred until the age of 40.

I have discussed the findings and recommendations with the patient.
If applicable, a reminder letter will be sent to the patient
regarding the next appointment.

BI-RADS CATEGORY  1: Negative.

## 2022-08-23 ENCOUNTER — Other Ambulatory Visit: Payer: Self-pay | Admitting: Obstetrics and Gynecology

## 2022-08-23 DIAGNOSIS — Z1231 Encounter for screening mammogram for malignant neoplasm of breast: Secondary | ICD-10-CM

## 2022-08-30 ENCOUNTER — Ambulatory Visit
Admission: RE | Admit: 2022-08-30 | Discharge: 2022-08-30 | Disposition: A | Discharge status: Home / Self Care | Payer: BC Managed Care – PPO | Source: Ambulatory Visit | Attending: Obstetrics and Gynecology | Admitting: Obstetrics and Gynecology

## 2022-08-30 DIAGNOSIS — Z1231 Encounter for screening mammogram for malignant neoplasm of breast: Secondary | ICD-10-CM
